# Patient Record
Sex: Male | Born: 1960 | Race: Black or African American | Hispanic: No | Marital: Single | Smoking: Current every day smoker
Health system: Southern US, Community
[De-identification: ages and names within clinical notes are randomized; demographics above are authoritative.]

---

## 1998-10-29 ENCOUNTER — Encounter: Payer: Self-pay | Admitting: Emergency Medicine

## 1998-10-29 ENCOUNTER — Emergency Department (HOSPITAL_COMMUNITY): Admission: EM | Admit: 1998-10-29 | Discharge: 1998-10-29 | Payer: Self-pay | Admitting: *Deleted

## 2002-04-02 ENCOUNTER — Emergency Department (HOSPITAL_COMMUNITY): Admission: EM | Admit: 2002-04-02 | Discharge: 2002-04-03 | Payer: Self-pay | Admitting: Emergency Medicine

## 2006-02-15 ENCOUNTER — Emergency Department (HOSPITAL_COMMUNITY): Admission: EM | Admit: 2006-02-15 | Discharge: 2006-02-15 | Payer: Self-pay | Admitting: Emergency Medicine

## 2011-02-23 ENCOUNTER — Emergency Department (HOSPITAL_COMMUNITY)
Admission: EM | Admit: 2011-02-23 | Discharge: 2011-02-23 | Disposition: A | Discharge status: Home / Self Care | Payer: No Typology Code available for payment source | Attending: Emergency Medicine | Admitting: Emergency Medicine

## 2011-02-23 ENCOUNTER — Emergency Department (HOSPITAL_COMMUNITY): Payer: No Typology Code available for payment source

## 2011-02-23 DIAGNOSIS — M25559 Pain in unspecified hip: Secondary | ICD-10-CM | POA: Insufficient documentation

## 2011-02-23 DIAGNOSIS — S7000XA Contusion of unspecified hip, initial encounter: Secondary | ICD-10-CM | POA: Insufficient documentation

## 2011-02-23 DIAGNOSIS — S9030XA Contusion of unspecified foot, initial encounter: Secondary | ICD-10-CM | POA: Insufficient documentation

## 2011-02-23 DIAGNOSIS — M79609 Pain in unspecified limb: Secondary | ICD-10-CM | POA: Insufficient documentation

## 2011-06-23 ENCOUNTER — Emergency Department (HOSPITAL_COMMUNITY)
Admission: EM | Admit: 2011-06-23 | Discharge: 2011-06-24 | Disposition: A | Discharge status: Home / Self Care | Payer: Self-pay | Attending: Emergency Medicine | Admitting: Emergency Medicine

## 2011-06-23 ENCOUNTER — Encounter: Payer: Self-pay | Admitting: Ophthalmology

## 2011-06-23 ENCOUNTER — Encounter (HOSPITAL_COMMUNITY): Payer: Self-pay | Admitting: *Deleted

## 2011-06-23 ENCOUNTER — Emergency Department (HOSPITAL_COMMUNITY): Payer: Self-pay

## 2011-06-23 DIAGNOSIS — H209 Unspecified iridocyclitis: Secondary | ICD-10-CM | POA: Insufficient documentation

## 2011-06-23 DIAGNOSIS — H538 Other visual disturbances: Secondary | ICD-10-CM | POA: Insufficient documentation

## 2011-06-23 DIAGNOSIS — R51 Headache: Secondary | ICD-10-CM | POA: Insufficient documentation

## 2011-06-23 MED ORDER — PREDNISOLONE ACETATE 1 % OP SUSP: OPHTHALMIC | Status: DC

## 2011-06-23 MED ORDER — PHENYLEPHRINE HCL 10 % OP SOLN
1.0000 [drp] | Freq: Once | OPHTHALMIC | Status: DC
  Filled 2011-06-23: qty 5

## 2011-06-23 MED ORDER — TETRACAINE HCL 0.5 % OP SOLN
1.0000 [drp] | Freq: Once | OPHTHALMIC | Status: AC
  Administered 2011-06-23: 1 [drp] via OPHTHALMIC
  Filled 2011-06-23: qty 2

## 2011-06-23 MED ORDER — LORAZEPAM 2 MG/ML IJ SOLN: 1.0000 mg | Freq: Once | INTRAMUSCULAR | Status: DC

## 2011-06-23 MED ORDER — TIMOLOL MALEATE 0.5 % OP SOLN
1.0000 [drp] | Freq: Once | OPHTHALMIC | Status: DC
  Filled 2011-06-23: qty 5

## 2011-06-23 MED ORDER — HYDROMORPHONE HCL PF 2 MG/ML IJ SOLN
1.0000 mg | Freq: Once | INTRAMUSCULAR | Status: AC
  Administered 2011-06-23: 1 mg via INTRAMUSCULAR
  Filled 2011-06-23: qty 1

## 2011-06-23 MED ORDER — PHENYLEPHRINE HCL 2.5 % OP SOLN
1.0000 [drp] | Freq: Once | OPHTHALMIC | Status: DC
  Filled 2011-06-23: qty 2

## 2011-06-23 MED ORDER — CYCLOPENTOLATE HCL 1 % OP SOLN
1.0000 [drp] | Freq: Once | OPHTHALMIC | Status: DC
  Filled 2011-06-23: qty 2

## 2011-06-23 MED ORDER — FLUORESCEIN SODIUM 1 MG OP STRP
1.0000 | ORAL_STRIP | Freq: Once | OPHTHALMIC | Status: AC
  Administered 2011-06-23: 1 via OPHTHALMIC
  Filled 2011-06-23: qty 1

## 2011-06-23 MED ORDER — ACETAZOLAMIDE SODIUM 500 MG IJ SOLR
500.0000 mg | Freq: Once | INTRAMUSCULAR | Status: DC
  Filled 2011-06-23: qty 500

## 2011-06-23 MED ORDER — APRACLONIDINE HCL 1 % OP SOLN
1.0000 [drp] | Freq: Once | OPHTHALMIC | Status: DC
  Filled 2011-06-23: qty 0.1
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
## 2011-06-23 NOTE — ED Notes (Signed)
Dr stated he did not need assistance with with the pt.
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
## 2011-06-23 NOTE — ED Notes (Signed)
PA at bedside.
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
## 2011-06-23 NOTE — Discharge Instructions (Signed)
Timolol 1 drop Right eye twice a day until follow up appointment with Dr. Gwen Pounds. Cyclogyl 1 drop Right eye twice a day until follow up appointment with Dr. Gwen Pounds. Travatan Z 1 drop Right eye at bedtime until follow up appointment with Dr. Gwen Pounds. Pred Forte 1% Right eye every 2 hours until follow up appointment with Dr. Gwen Pounds. Call on Monday, 06/25/11 to make follow up appointments with Dr. Pollyann Kennedy and Dr. Gwen Pounds.

## 2011-06-23 NOTE — ED Notes (Signed)
Medications obtained.  Waiting for Dr to arrive before medications are charted as given.

## 2011-06-23 NOTE — ED Notes (Signed)
MD at bedside. 

## 2011-06-24 NOTE — ED Provider Notes (Signed)
Medical screening examination/treatment/procedure(s) were conducted as a shared visit with non-physician practitioner(s) and myself.  I personally evaluated the patient during the encounter  Pt seen by ophtho, his pressures were corrected, he feels improved, meds/followup arranged by Juanda Bond, MD 06/24/11 [phone]

## 2014-01-15 ENCOUNTER — Emergency Department (HOSPITAL_COMMUNITY)
Admission: EM | Admit: 2014-01-15 | Discharge: 2014-01-15 | Disposition: A | Discharge status: Home / Self Care | Payer: Self-pay | Attending: Emergency Medicine | Admitting: Emergency Medicine

## 2014-01-15 ENCOUNTER — Encounter (HOSPITAL_COMMUNITY): Payer: Self-pay | Admitting: Emergency Medicine

## 2014-01-15 ENCOUNTER — Emergency Department (HOSPITAL_COMMUNITY): Payer: Self-pay

## 2014-01-15 ENCOUNTER — Emergency Department (HOSPITAL_COMMUNITY): Payer: No Typology Code available for payment source

## 2014-01-15 DIAGNOSIS — S46909A Unspecified injury of unspecified muscle, fascia and tendon at shoulder and upper arm level, unspecified arm, initial encounter: Secondary | ICD-10-CM | POA: Insufficient documentation

## 2014-01-15 DIAGNOSIS — Z79899 Other long term (current) drug therapy: Secondary | ICD-10-CM | POA: Insufficient documentation

## 2014-01-15 DIAGNOSIS — IMO0002 Reserved for concepts with insufficient information to code with codable children: Secondary | ICD-10-CM | POA: Insufficient documentation

## 2014-01-15 DIAGNOSIS — Y92410 Unspecified street and highway as the place of occurrence of the external cause: Secondary | ICD-10-CM | POA: Insufficient documentation

## 2014-01-15 DIAGNOSIS — S4980XA Other specified injuries of shoulder and upper arm, unspecified arm, initial encounter: Secondary | ICD-10-CM | POA: Insufficient documentation

## 2014-01-15 DIAGNOSIS — Z23 Encounter for immunization: Secondary | ICD-10-CM | POA: Insufficient documentation

## 2014-01-15 DIAGNOSIS — F17200 Nicotine dependence, unspecified, uncomplicated: Secondary | ICD-10-CM | POA: Insufficient documentation

## 2014-01-15 DIAGNOSIS — Y9389 Activity, other specified: Secondary | ICD-10-CM | POA: Insufficient documentation

## 2014-01-15 DIAGNOSIS — S42211A Unspecified displaced fracture of surgical neck of right humerus, initial encounter for closed fracture: Secondary | ICD-10-CM

## 2014-01-15 DIAGNOSIS — S298XXA Other specified injuries of thorax, initial encounter: Secondary | ICD-10-CM | POA: Insufficient documentation

## 2014-01-15 DIAGNOSIS — S42213A Unspecified displaced fracture of surgical neck of unspecified humerus, initial encounter for closed fracture: Secondary | ICD-10-CM | POA: Insufficient documentation

## 2014-01-15 MED ORDER — TETANUS-DIPHTH-ACELL PERTUSSIS 5-2.5-18.5 LF-MCG/0.5 IM SUSP
0.5000 mL | Freq: Once | INTRAMUSCULAR | Status: AC
  Administered 2014-01-15: 0.5 mL via INTRAMUSCULAR
  Filled 2014-01-15: qty 0.5

## 2014-01-15 MED ORDER — OXYCODONE-ACETAMINOPHEN 5-325 MG PO TABS: 1.0000 | ORAL_TABLET | Freq: Four times a day (QID) | ORAL | Status: DC | PRN

## 2014-01-15 MED ORDER — OXYCODONE-ACETAMINOPHEN 5-325 MG PO TABS
2.0000 | ORAL_TABLET | Freq: Once | ORAL | Status: AC
  Administered 2014-01-15: 2 via ORAL
  Filled 2014-01-15: qty 2

## 2014-01-15 MED ORDER — OXYCODONE-ACETAMINOPHEN 5-325 MG PO TABS
1.0000 | ORAL_TABLET | Freq: Once | ORAL | Status: AC
Start: 2014-01-15 — End: 2014-01-15
  Administered 2014-01-15: 1 via ORAL
  Filled 2014-01-15: qty 1
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
## 2014-01-15 NOTE — ED Notes (Signed)
Arm sling placed at bedside. Pt currently using restroom.
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
## 2019-03-09 ENCOUNTER — Other Ambulatory Visit: Payer: Self-pay

## 2019-03-09 DIAGNOSIS — Z20822 Contact with and (suspected) exposure to covid-19: Secondary | ICD-10-CM

## 2019-03-10 LAB — NOVEL CORONAVIRUS, NAA: SARS-CoV-2, NAA: NOT DETECTED

## 2023-03-04 ENCOUNTER — Encounter: Payer: Self-pay | Admitting: Physician Assistant

## 2023-03-04 ENCOUNTER — Ambulatory Visit: Payer: 59 | Admitting: Physician Assistant

## 2023-03-04 VITALS — BP 139/80 | HR 102 | Ht 69.0 in | Wt 188.0 lb

## 2023-03-04 DIAGNOSIS — R030 Elevated blood-pressure reading, without diagnosis of hypertension: Secondary | ICD-10-CM

## 2023-03-04 DIAGNOSIS — F17200 Nicotine dependence, unspecified, uncomplicated: Secondary | ICD-10-CM

## 2023-03-04 DIAGNOSIS — Z5902 Unsheltered homelessness: Secondary | ICD-10-CM | POA: Diagnosis not present

## 2023-03-04 DIAGNOSIS — Z125 Encounter for screening for malignant neoplasm of prostate: Secondary | ICD-10-CM | POA: Diagnosis not present

## 2023-03-04 NOTE — Patient Instructions (Signed)
Please return to the mobile unit for follow-up.  We will review your lab results at your follow-up appointment.  Please check your blood pressure, keep a written log and bring with you to that office visit  Roney Jaffe, PA-C Physician Assistant [facility] [email or web address]   How to Take Your Blood Pressure Blood pressure is a measurement of how strongly your blood is pressing against the walls of your arteries. Arteries are blood vessels that carry blood from your heart throughout your body. Your health care provider takes your blood pressure at each office visit. You can also take your own blood pressure at home with a blood pressure monitor. You may need to take your own blood pressure to: Confirm a diagnosis of high blood pressure (hypertension). Monitor your blood pressure over time. Make sure your blood pressure medicine is working. Supplies needed: Blood pressure monitor. A chair to sit in. This should be a chair where you can sit upright with your back supported. Do not sit on a soft couch or an armchair. Table or desk. Small notebook and pencil or pen. How to prepare To get the most accurate reading, avoid the following for 30 minutes before you check your blood pressure: Drinking caffeine. Drinking alcohol. Eating. Smoking. Exercising. Five minutes before you check your blood pressure: Use the bathroom and urinate so that you have an empty bladder. Sit quietly in a chair. Do not talk. How to take your blood pressure To check your blood pressure, follow the instructions in the manual that came with your blood pressure monitor. If you have a digital blood pressure monitor, the instructions may be as follows: Sit up straight in a chair. Place your feet on the floor. Do not cross your ankles or legs. Rest your left arm at the level of your heart on a table or desk or on the arm of a chair. Pull up your shirt  sleeve. Wrap the blood pressure cuff around the upper part of your left arm, 1 inch (2.5 cm) above your elbow. It is best to wrap the cuff around bare skin. Fit the cuff snugly, but not too tightly, around your arm. You should be able to place only one finger between the cuff and your arm. Position the cord so that it rests in the bend of your elbow. Press the power button. Sit quietly while the cuff inflates and deflates. Read the digital reading on the monitor screen and write the numbers down (record them) in a notebook. Wait 2-3 minutes, then repeat the steps, starting at step 1. What does my blood pressure reading mean? A blood pressure reading consists of a higher number over a lower number. Ideally, your blood pressure should be below 120/80. The first ("top") number is called the systolic pressure. It is a measure of the pressure in your arteries as your heart beats. The second ("bottom") number is called the diastolic pressure. It is a measure of the pressure in your arteries as the heart relaxes. Blood pressure is classified into four stages. The following are the stages for adults who do not have a short-term serious illness or a chronic condition. Systolic pressure and diastolic pressure are measured in a unit called mm Hg (millimeters of mercury).  Normal Systolic pressure: below 120. Diastolic pressure: below 80. Elevated Systolic pressure: 120-129. Diastolic pressure: below 80. Hypertension stage 1 Systolic pressure: 130-139. Diastolic pressure: 80-89. Hypertension stage 2 Systolic pressure: 140 or above. Diastolic pressure: 90 or above. You can have  elevated blood pressure or hypertension even if only the systolic or only the diastolic number in your reading is higher than normal. Follow these instructions at home: Medicines Take over-the-counter and prescription medicines only as told by your health care provider. Tell your health care provider if you are having any side  effects from blood pressure medicine. General instructions Check your blood pressure as often as recommended by your health care provider. Check your blood pressure at the same time every day. Take your monitor to the next appointment with your health care provider to make sure that: You are using it correctly. It provides accurate readings. Understand what your goal blood pressure numbers are. Keep all follow-up visits. This is important. General tips Your health care provider can suggest a reliable monitor that will meet your needs. There are several types of home blood pressure monitors. Choose a monitor that has an arm cuff. Do not choose a monitor that measures your blood pressure from your wrist or finger. Choose a cuff that wraps snugly, not too tight or too loose, around your upper arm. You should be able to fit only one finger between your arm and the cuff. You can buy a blood pressure monitor at most drugstores or online. Where to find more information American Heart Association: www.heart.org Contact a health care provider if: Your blood pressure is consistently high. Your blood pressure is suddenly low. Get help right away if: Your systolic blood pressure is higher than 180. Your diastolic blood pressure is higher than 120. These symptoms may be an emergency. Get help right away. Call 911. Do not wait to see if the symptoms will go away. Do not drive yourself to the hospital. Summary Blood pressure is a measurement of how strongly your blood is pressing against the walls of your arteries. A blood pressure reading consists of a higher number over a lower number. Ideally, your blood pressure should be below 120/80. Check your blood pressure at the same time every day. Avoid caffeine, alcohol, smoking, and exercise for 30 minutes prior to checking your blood pressure. These agents can affect the accuracy of the blood pressure reading. This information is not intended to replace  advice given to you by your health care provider. Make sure you discuss any questions you have with your health care provider. Document Revised: 12/22/2020 Document Reviewed: 12/22/2020 Elsevier Patient Education  2024 [employer].

## 2023-03-04 NOTE — Progress Notes (Unsigned)
New Patient Office Visit  Subjective    Patient ID: Albert Phillips, male    DOB: Sep 16, 1960  Age: 62 y.o. MRN: [id number]  CC:  Chief Complaint  Patient presents with   Medical Clearance    For [facility].   Hypertension    Elevated readings     HPI Albert Phillips states that he had an elevated blood pressure reading when presenting to give plasma and was told to receive a letter from a medical provider stating that it was okay for him to donate plasma.  States that he does not have health insurance, states that he does not have a primary care provider.  States that he has not previously been prescribed medication for hypertension.  States that he does not check his blood pressure on a regular basis.  States that he is currently working with a Sports coach at the [facility] [employer] and has filed for disability due to chronic left knee pain.  Outpatient Encounter Medications as of 03/04/2023  Medication Sig   [DISCONTINUED] Aspirin-Caffeine 845-65 MG PACK Take 1 Package by mouth daily as needed (headache, and body aches). (Patient not taking: Reported on 03/04/2023)   [DISCONTINUED] Cyanocobalamin (B-12 PO) Take 1 tablet by mouth daily. (Patient not taking: Reported on 03/04/2023)   [DISCONTINUED] oxyCODONE-acetaminophen (PERCOCET/ROXICET) 5-325 MG per tablet Take 1 tablet by mouth every 6 (six) hours as needed for moderate pain or severe pain. (Patient not taking: Reported on 03/04/2023)   No facility-administered encounter medications on file as of 03/04/2023.    History reviewed. No pertinent past medical history.  History reviewed. No pertinent surgical history.  History reviewed. No pertinent family history.  Social History   Socioeconomic History   Marital status: Single    Spouse name: Not on file   Number of children: Not on file   Years of education: Not on file   Highest education level: Not on file  Occupational History   Not on file  Tobacco Use    Smoking status: Every Day   Smokeless tobacco: Not on file  Vaping Use   Vaping status: Never Used  Substance and Sexual Activity   Alcohol use: Yes    Comment: Occasional- beer   Drug use: No   Sexual activity: Not on file  Other Topics Concern   Not on file  Social History Narrative   Not on file   Social Determinants of Health   Financial Resource Strain: Not on file  Food Insecurity: Not on file  Transportation Needs: Not on file  Physical Activity: Not on file  Stress: Not on file  Social Connections: Unknown (12/13/2022)   [facility]   Social Network    Social Network: Not on file  Intimate Partner Violence: Unknown (12/13/2022)   Received from [facility]   HITS    Physically Hurt: Not on file    Insult or Talk Down To: Not on file    Threaten Physical Harm: Not on file    Scream or Curse: Not on file    Review of Systems  Constitutional: Negative.   HENT: Negative.    Eyes: Negative.   Respiratory:  Negative for shortness of breath.   Cardiovascular:  Negative for chest pain.  Gastrointestinal: Negative.   Genitourinary: Negative.   Musculoskeletal: Negative.   Skin: Negative.   Neurological: Negative.   Endo/Heme/Allergies: Negative.   Psychiatric/Behavioral: Negative.          Objective  BP 139/80 (BP Location: Left Arm, Patient Position: Sitting, Cuff Size: Large)   Pulse (!) 102   Ht 5\' 9"  (1.753 m)   Wt 188 lb (85.3 kg)   SpO2 97%   BMI 27.76 kg/m   Physical Exam Vitals and nursing note reviewed.  Constitutional:      Appearance: Normal appearance.  HENT:     Head: Normocephalic and atraumatic.     Right Ear: External ear normal.     Left Ear: External ear normal.     Nose: Nose normal.     Mouth/Throat:     Mouth: Mucous membranes are moist.     Pharynx: Oropharynx is clear.  Cardiovascular:     Rate and Rhythm: Normal rate and regular rhythm.     Pulses: Normal pulses.     Heart sounds: Normal heart sounds.   Pulmonary:     Effort: Pulmonary effort is normal.     Breath sounds: Normal breath sounds.  Musculoskeletal:        General: Normal range of motion.     Cervical back: Normal range of motion and neck supple.  Skin:    General: Skin is warm and dry.  Neurological:     General: No focal deficit present.     Mental Status: He is alert and oriented to person, place, and time.  Psychiatric:        Mood and Affect: Mood normal.        Behavior: Behavior normal.        Thought Content: Thought content normal.        Judgment: Judgment normal.       Assessment & Plan:   Problem List Items Addressed This Visit   None Visit Diagnoses     Elevated blood pressure reading in office without diagnosis of hypertension    -  Primary   Relevant Orders   CBC with Differential/Platelet (Completed)   Comp. Metabolic Panel (12) (Completed)   Screening PSA (prostate specific antigen)       Relevant Orders   PSA (Completed)   Tobacco use disorder       Unsheltered homelessness         1. Elevated blood pressure reading in office without diagnosis of hypertension Blood pressure reading today upper and normal limits.  Patient encouraged to check blood pressure as able, keep a written log and have available for all office visits.  Patient agreeable to return to mobile unit in 2 weeks for follow-up.  Patient currently does not have phone, will review lab results upon his return.  Patient understands and agrees.  Red flags given for prompt reevaluation. - CBC with Differential/Platelet - Comp. Metabolic Panel (12)  2. Screening PSA (prostate specific antigen)  - PSA  3. Tobacco use disorder   4. Unsheltered homelessness He is currently working with Sports coach from [facility].s   I have reviewed the patient's medical history (PMH, PSH, Social History, Family History, Medications, and allergies) , and have been updated if relevant. I spent 30 minutes reviewing chart and  face to face time with  patient.     Return in about 2 weeks (around 03/18/2023) for With [facility].   Kasandra Knudsen Mayers, PA-C

## 2023-03-05 ENCOUNTER — Encounter: Payer: Self-pay | Admitting: Physician Assistant

## 2023-03-05 LAB — COMP. METABOLIC PANEL (12)
AST: 21 [IU]/L (ref 0–40)
Albumin: 4.1 g/dL (ref 3.8–4.9)
Alkaline Phosphatase: 127 [IU]/L — ABNORMAL HIGH (ref 44–121)
BUN/Creatinine Ratio: 12 (ref 10–24)
BUN: 10 mg/dL (ref 8–27)
Bilirubin Total: 0.8 mg/dL (ref 0.0–1.2)
Calcium: 9.2 mg/dL (ref 8.6–10.2)
Chloride: 104 mmol/L (ref 96–106)
Creatinine, Ser: 0.83 mg/dL (ref 0.76–1.27)
Globulin, Total: 2.8 g/dL (ref 1.5–4.5)
Glucose: 110 mg/dL — ABNORMAL HIGH (ref 70–99)
Potassium: 4.2 mmol/L (ref 3.5–5.2)
Sodium: 140 mmol/L (ref 134–144)
Total Protein: 6.9 g/dL (ref 6.0–8.5)
eGFR: 100 mL/min/{1.73_m2} (ref >59)

## 2023-03-05 LAB — CBC WITH DIFFERENTIAL/PLATELET
Basophils Absolute: 0.1 10*3/uL (ref 0.0–0.2)
Basos: 1 %
EOS (ABSOLUTE): 0.1 10*3/uL (ref 0.0–0.4)
Eos: 2 %
Hematocrit: 48.1 % (ref 37.5–51.0)
Hemoglobin: 15.8 g/dL (ref 13.0–17.7)
Immature Grans (Abs): 0 10*3/uL (ref 0.0–0.1)
Immature Granulocytes: 0 %
Lymphocytes Absolute: 2 10*3/uL (ref 0.7–3.1)
Lymphs: 29 %
MCH: 33.8 pg — ABNORMAL HIGH (ref 26.6–33.0)
MCHC: 32.8 g/dL (ref 31.5–35.7)
MCV: 103 fL — ABNORMAL HIGH (ref 79–97)
Monocytes Absolute: 0.7 10*3/uL (ref 0.1–0.9)
Monocytes: 10 %
Neutrophils Absolute: 3.9 10*3/uL (ref 1.4–7.0)
Neutrophils: 58 %
Platelets: 294 10*3/uL (ref 150–450)
RBC: 4.68 x10E6/uL (ref 4.14–5.80)
RDW: 13.8 % (ref 11.6–15.4)
WBC: 6.8 10*3/uL (ref 3.4–10.8)

## 2023-03-05 LAB — PSA: Prostate Specific Ag, Serum: 1.6 ng/mL (ref 0.0–4.0)
